# Patient Record
Sex: Male | Born: 1945 | Race: Black or African American | Hispanic: No | Marital: Single | Smoking: Former smoker
Health system: Southern US, Community
[De-identification: ages and names within clinical notes are randomized; demographics above are authoritative.]

## PROBLEM LIST (undated history)

## (undated) DIAGNOSIS — M109 Gout, unspecified: Secondary | ICD-10-CM

## (undated) DIAGNOSIS — I4891 Unspecified atrial fibrillation: Secondary | ICD-10-CM

## (undated) DIAGNOSIS — I10 Essential (primary) hypertension: Secondary | ICD-10-CM

---

## 2013-02-28 ENCOUNTER — Emergency Department: Payer: Self-pay | Admitting: Emergency Medicine

## 2018-05-08 ENCOUNTER — Other Ambulatory Visit: Payer: Self-pay

## 2018-05-08 ENCOUNTER — Emergency Department: Payer: Medicare Other

## 2018-05-08 ENCOUNTER — Emergency Department
Admission: EM | Admit: 2018-05-08 | Discharge: 2018-05-08 | Disposition: A | Discharge status: Home / Self Care | Payer: Medicare Other | Attending: Emergency Medicine | Admitting: Emergency Medicine

## 2018-05-08 ENCOUNTER — Encounter: Payer: Self-pay | Admitting: Emergency Medicine

## 2018-05-08 DIAGNOSIS — Z7901 Long term (current) use of anticoagulants: Secondary | ICD-10-CM | POA: Insufficient documentation

## 2018-05-08 DIAGNOSIS — R319 Hematuria, unspecified: Secondary | ICD-10-CM | POA: Insufficient documentation

## 2018-05-08 DIAGNOSIS — Z87891 Personal history of nicotine dependence: Secondary | ICD-10-CM | POA: Insufficient documentation

## 2018-05-08 DIAGNOSIS — I10 Essential (primary) hypertension: Secondary | ICD-10-CM | POA: Insufficient documentation

## 2018-05-08 DIAGNOSIS — Z79899 Other long term (current) drug therapy: Secondary | ICD-10-CM | POA: Diagnosis not present

## 2018-05-08 DIAGNOSIS — R160 Hepatomegaly, not elsewhere classified: Secondary | ICD-10-CM

## 2018-05-08 HISTORY — DX: Gout, unspecified: M10.9

## 2018-05-08 HISTORY — DX: Unspecified atrial fibrillation: I48.91

## 2018-05-08 HISTORY — DX: Essential (primary) hypertension: I10

## 2018-05-08 LAB — CBC
HCT: 42.1 % (ref 39.0–52.0)
Hemoglobin: 13.6 g/dL (ref 13.0–17.0)
MCH: 26 pg (ref 26.0–34.0)
MCHC: 32.3 g/dL (ref 30.0–36.0)
MCV: 80.3 fL (ref 80.0–100.0)
Platelets: 237 10*3/uL (ref 150–400)
RBC: 5.24 MIL/uL (ref 4.22–5.81)
RDW: 15 % (ref 11.5–15.5)
WBC: 12.4 10*3/uL — ABNORMAL HIGH (ref 4.0–10.5)
nRBC: 0 % (ref 0.0–0.2)

## 2018-05-08 LAB — URINALYSIS, COMPLETE (UACMP) WITH MICROSCOPIC
Bacteria, UA: NONE SEEN
Bilirubin Urine: NEGATIVE
Glucose, UA: NEGATIVE mg/dL
KETONES UR: 5 mg/dL — AB
Leukocytes, UA: NEGATIVE
NITRITE: NEGATIVE
Protein, ur: 30 mg/dL — AB
Specific Gravity, Urine: 1.017 (ref 1.005–1.030)
pH: 5 (ref 5.0–8.0)

## 2018-05-08 LAB — COMPREHENSIVE METABOLIC PANEL
ALT: 38 U/L (ref 0–44)
AST: 46 U/L — ABNORMAL HIGH (ref 15–41)
Albumin: 4 g/dL (ref 3.5–5.0)
Alkaline Phosphatase: 90 U/L (ref 38–126)
Anion gap: 11 (ref 5–15)
BUN: 16 mg/dL (ref 8–23)
CHLORIDE: 97 mmol/L — AB (ref 98–111)
CO2: 23 mmol/L (ref 22–32)
Calcium: 9.1 mg/dL (ref 8.9–10.3)
Creatinine, Ser: 0.91 mg/dL (ref 0.61–1.24)
GFR calc Af Amer: >60 mL/min (ref >60)
GFR calc non Af Amer: >60 mL/min (ref >60)
Glucose, Bld: 111 mg/dL — ABNORMAL HIGH (ref 70–99)
Potassium: 3.8 mmol/L (ref 3.5–5.1)
Sodium: 131 mmol/L — ABNORMAL LOW (ref 135–145)
Total Bilirubin: 1.7 mg/dL — ABNORMAL HIGH (ref 0.3–1.2)
Total Protein: 8.5 g/dL — ABNORMAL HIGH (ref 6.5–8.1)

## 2018-05-08 MED ORDER — OXYCODONE-ACETAMINOPHEN 5-325 MG PO TABS
1.0000 | ORAL_TABLET | Freq: Once | ORAL | Status: AC
  Administered 2018-05-08: 1 via ORAL
  Filled 2018-05-08: qty 1

## 2018-05-08 MED ORDER — PREDNISONE 20 MG PO TABS
60.0000 mg | ORAL_TABLET | Freq: Once | ORAL | Status: AC
  Administered 2018-05-08: 60 mg via ORAL
  Filled 2018-05-08: qty 3

## 2018-05-08 NOTE — ED Provider Notes (Signed)
[facility] Emergency Department Provider Note    First MD Initiated Contact with Patient 05/08/18 0600     (approximate)  I have reviewed the triage vital signs and the nursing notes.   HISTORY  Chief Complaint Hematuria    HPI Lucas Wheeler is a 73 y.o. male with below list of chronic medical conditions including atrial fibrillation gout and hypertension presents to the emergency department with dark urine noted this morning with concern for possible hematuria.  Of note the patient was started on Pradaxa 5 days ago.  Patient denies any abdominal pain or flank pain.  Patient denies any fever.  Patient does admit however to bilateral knee and foot pain which patient states is consistent with previous episodes of gout.  Patient states he was advised to not take indomethacin as a result of being prescribed Pradaxa.   Past Medical History:  Diagnosis Date  . Atrial fibrillation (HCC)   . Gout   . Hypertension     There are no active problems to display for this patient.    Prior to Admission medications   Medication Sig Start Date End Date Taking? Authorizing Provider  allopurinol (ZYLOPRIM) 300 MG tablet Take 300 mg by mouth daily.   Yes [provider]  amLODipine (NORVASC) 10 MG tablet Take 10 mg by mouth daily.   Yes [provider]  atorvastatin (LIPITOR) 80 MG tablet Take 40 mg by mouth at bedtime.   Yes [provider]  dabigatran (PRADAXA) 150 MG CAPS capsule Take 150 mg by mouth 2 (two) times daily.   Yes [provider]    Allergies No known drug allergies No family history on file.  Social History Social History   Tobacco Use  . Smoking status: Former Games developer  . Smokeless tobacco: Former Engineer, water Use Topics  . Alcohol use: Not Currently  . Drug use: Never    Review of Systems Constitutional: No fever/chills Eyes: No visual changes. ENT: No sore throat. Cardiovascular: Denies chest  pain. Respiratory: Denies shortness of breath. Gastrointestinal: No abdominal pain.  No nausea, no vomiting.  No diarrhea.  No constipation. Genitourinary: Negative for dysuria.  Positive for dark urine musculoskeletal: Negative for neck pain.  Negative for back pain. Integumentary: Negative for rash. Neurological: Negative for headaches, focal weakness or numbness.    ____________________________________________   PHYSICAL EXAM:  VITAL SIGNS: ED Triage Vitals  Enc Vitals Group     BP 05/08/18 0549 (!) 154/63     Pulse Rate 05/08/18 0549 100     Resp 05/08/18 0549 18     Temp 05/08/18 0549 98.1 F (36.7 C)     Temp Source 05/08/18 0549 Oral     SpO2 05/08/18 0549 99 %     Weight 05/08/18 0550 106.1 kg (234 lb)     Height 05/08/18 0550 1.905 m (6\' 3" )     Head Circumference --      Peak Flow --      Pain Score 05/08/18 0550 8     Pain Loc --      Pain Edu? --      Excl. in GC? --     Constitutional: Alert and oriented. Well appearing and in no acute distress. Eyes: Conjunctivae are normal.  Mouth/Throat: Mucous membranes are moist.  Oropharynx non-erythematous. Neck: No stridor.   Cardiovascular: Normal rate, regular rhythm. Good peripheral circulation. Grossly normal heart sounds. Respiratory: Normal respiratory effort.  No retractions. Lungs CTAB. Gastrointestinal:  Soft and nontender. No distention.  Musculoskeletal: No lower extremity tenderness nor edema. No gross deformities of extremities. Neurologic:  Normal speech and language. No gross focal neurologic deficits are appreciated.  Skin:  Skin is warm, dry and intact. No rash noted. Psychiatric: Mood and affect are normal. Speech and behavior are normal.  ____________________________________________   LABS (all labs ordered are listed, but only abnormal results are displayed)  Labs Reviewed  CBC - Abnormal; Notable for the following components:      Result Value   WBC 12.4 (*)    All other components within  normal limits  COMPREHENSIVE METABOLIC PANEL - Abnormal; Notable for the following components:   Sodium 131 (*)    Chloride 97 (*)    Glucose, Bld 111 (*)    Total Protein 8.5 (*)    AST 46 (*)    Total Bilirubin 1.7 (*)    All other components within normal limits  URINALYSIS, COMPLETE (UACMP) WITH MICROSCOPIC       Procedures   ____________________________________________   INITIAL IMPRESSION / ASSESSMENT AND PLAN / ED COURSE  As part of my medical decision making, I reviewed the following data within the electronic MEDICAL RECORD NUMBER   73 year old male currently anticoagulated with Pradaxa presenting with painless hematuria.  Awaiting urinalysis and CT renal studies.  Patient given prednisone and Percocet for knee and foot pain secondary to gout.  Patient's care transferred to Dr. Alphonzo Lemmings ____________________________________________  FINAL CLINICAL IMPRESSION(S) / ED DIAGNOSES  Final diagnoses:  Hematuria, unspecified type     MEDICATIONS GIVEN DURING THIS VISIT:  Medications  oxyCODONE-acetaminophen (PERCOCET/ROXICET) 5-325 MG per tablet 1 tablet (1 tablet Oral Given 05/08/18 0622)  predniSONE (DELTASONE) tablet 60 mg (60 mg Oral Given 05/08/18 8786)     ED Discharge Orders    None       Note:  This document was prepared using Dragon voice recognition software and may include unintentional dictation errors.    Darci Current, MD 05/08/18 [phone]

## 2018-05-08 NOTE — ED Notes (Signed)
Report received, care of pt assumed.  Pt returning from CT at this time, NAD noted, will monitor.

## 2018-05-08 NOTE — ED Triage Notes (Signed)
EMS pt to rm 25 from home with report of bloody red urine this morning when he got up to urinate. Denies pain, discomfort or other co's. Hx of afib and was put on blood thinner this last Tuesday. Takes the medication 2 x a day. Does not know the name.

## 2018-05-08 NOTE — Discharge Instructions (Addendum)
Return to the emergency room for severe bleeding, weakness, lightheaded, black stool, bleeding from your bottom, or any other new or worrisome symptoms including fever or pain.  We do notice, on your CT scan, that there is a 3 cm mass.  This could be benign but it could also be something very serious like cancer.  We do not want to scare you, but we want to make sure that you understand it is very important to follow-up.  Please tell your primary care doctor about this, and I am printing here the results from the CT scan so that you know what to tell them.  That will mean that there is private medical information on the street please do not lose it.  It is important that you call your primary care doctor and the cancer center in the next couple days to have this further evaluated.   1. No nephroureterolithiasis.  No hydronephrosis. 2. Indeterminate 3 cm low-attenuation mass within the right hepatic lobe. This needs dedicated evaluation with pre and post contrast-enhanced CT or MRI. 3. Indeterminate retroperitoneal and pelvic lymphadenopathy as described above. Recommend clinical and laboratory correlation to exclude lymphoproliferative disorder. If these are unremarkable, recommend follow-up CT in 3 months to assess for stability. 4. Left inguinal hernia containing a portion of nonobstructed descending colon and fat. 5. Abdominal aorta measures 2.5 cm. Ectatic abdominal aorta at risk for aneurysm development. Recommend followup by ultrasound in 5 years. This recommendation follows ACR consensus guidelines: White Paper of the ACR Incidental Findings Committee II on Vascular Findings. J Am Coll Radiol 2013; 10:789-794.

## 2018-05-08 NOTE — ED Provider Notes (Addendum)
-----------------------------------------   9:27 AM on 05/08/2018 -----------------------------------------  Not to me this morning at 715, patient with recent addition of anticoagulation to his med list, because of atrial fibrillation presents with one episode of mild hematuria.  No melena no bright red blood per rectum no pain no swelling no flank pain no fever no signs of infection, patient had a CT scan which did not show any primary urologic pathology but which did reveal a small liver lesion and some questionable lymphadenopathy.  I explained all of this expressly and extensively to the patient and his daughter, they understand that he must get outpatient follow-up for this to rule out cancer and I did tell him it could be cancer.  They understand the need for follow-up and I will provide them with that.  In addition, we will have him follow-up with urology for his hematuria.  Given that he does have some degree of risk of CVA, and this seems to be a mild amount of bleeding at this time we will not reverse his anticoagulation but I did give him extensive return precautions about this.  He is not anemic there is no evidence of significant bleeding, no evidence of infection, patient and family are very comfortable with this plan requesting discharge which we will now provide.   Jeanmarie Plant, MD 05/08/18 5643    Jeanmarie Plant, MD 05/08/18 [phone]

## 2018-05-09 LAB — URINE CULTURE: Culture: NO GROWTH

## 2020-05-29 IMAGING — CT CT RENAL STONE PROTOCOL
2 of 4 series · 15 of 46 positions shown, 17 images · non-contrast
Comparison: None.

CLINICAL DATA: Patient with hematuria.

EXAM:
CT ABDOMEN AND PELVIS WITHOUT CONTRAST
TECHNIQUE: Multidetector CT imaging of the abdomen and pelvis was performed
following the standard protocol without IV contrast.

[Series 2: stone full standard · axial · 0.79mm/px · z∈[-1198,-743]mm · 12 of 101 slices shown, 14 images]
[im 5/101  soft-tissue]
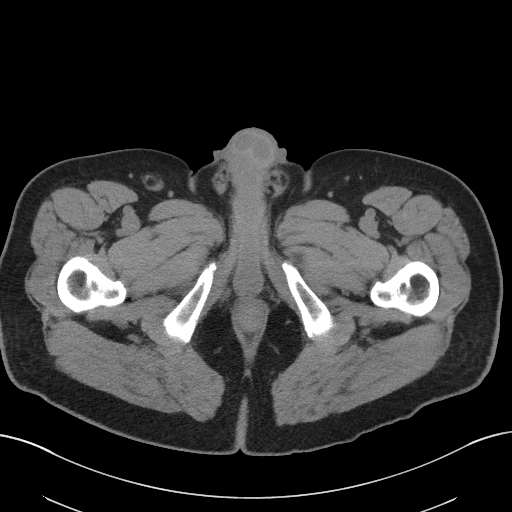
[im 5/101  bone]
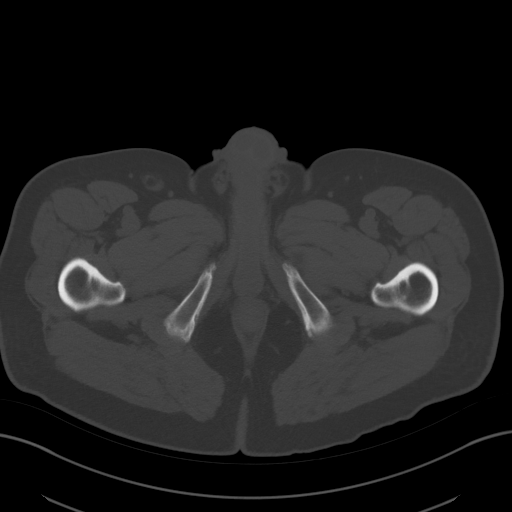
[im 14/101  soft-tissue]
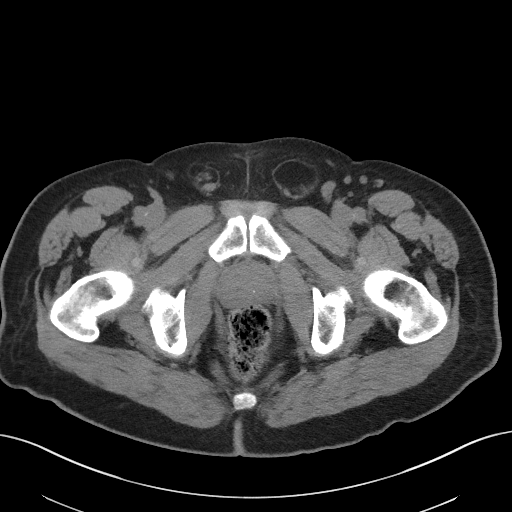
[im 22/101  soft-tissue]
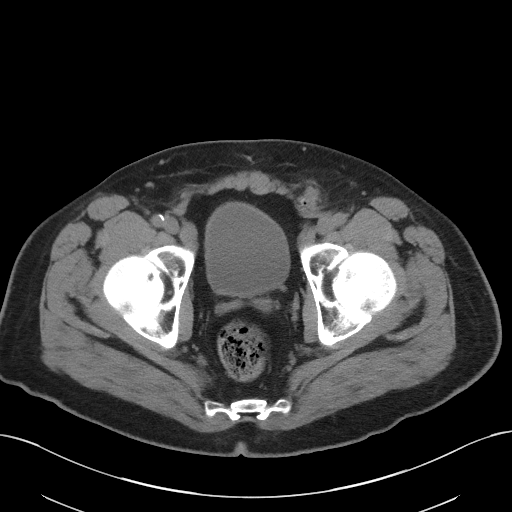
[im 31/101  soft-tissue]
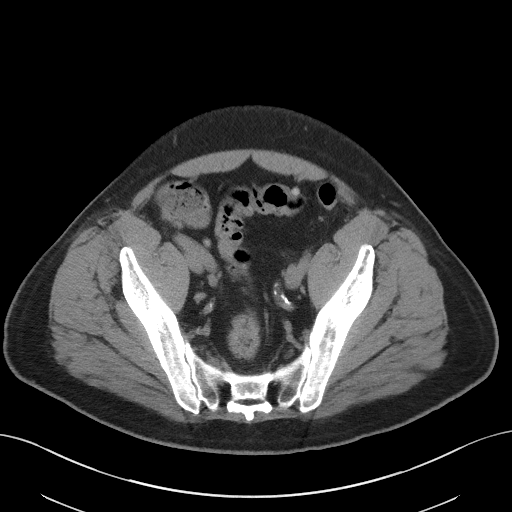
[im 40/101  soft-tissue]
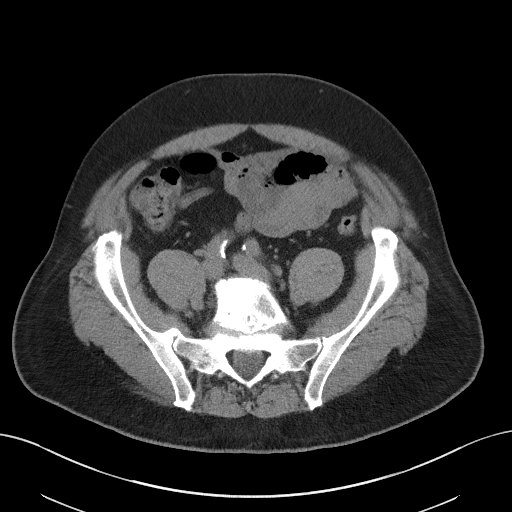
[im 48/101  soft-tissue]
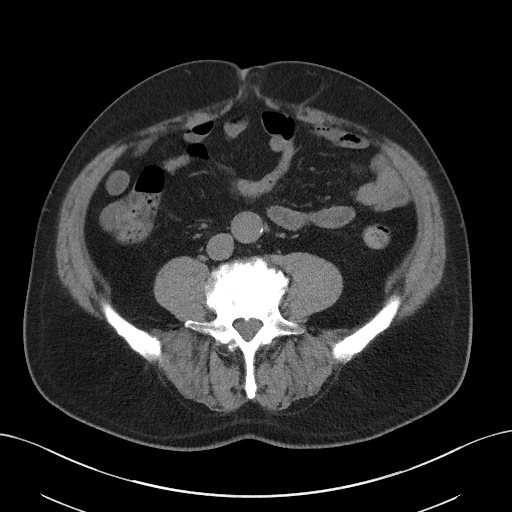
[im 53/101  soft-tissue]
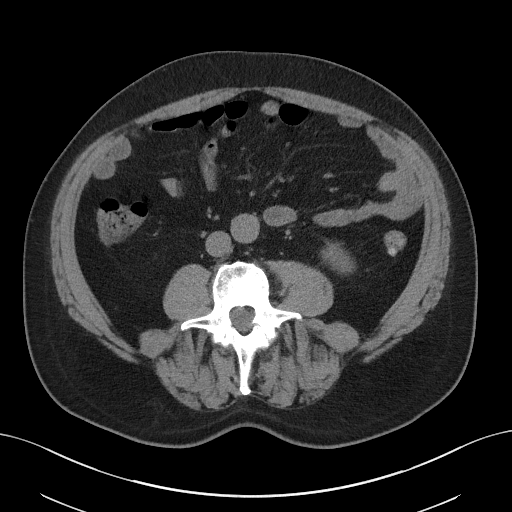
[im 61/101  soft-tissue]
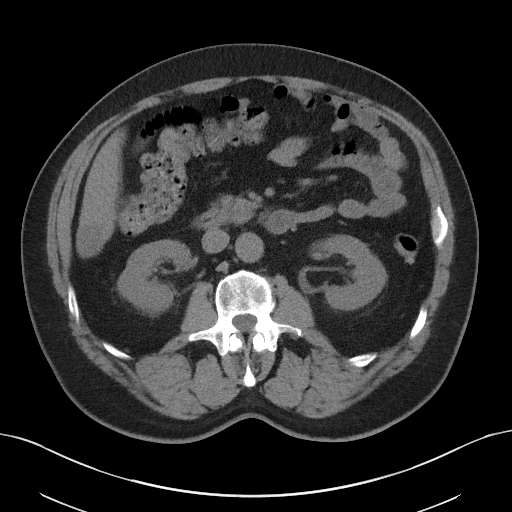
[im 70/101  soft-tissue]
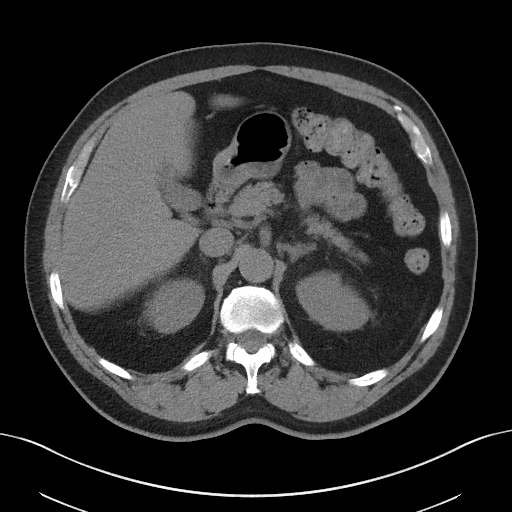
[im 70/101  bone]
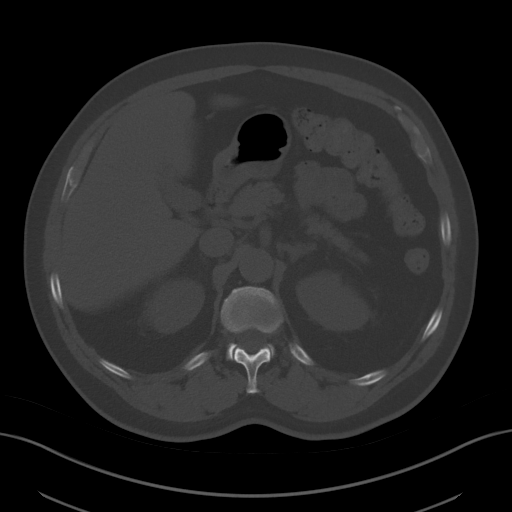
[im 79/101  soft-tissue]
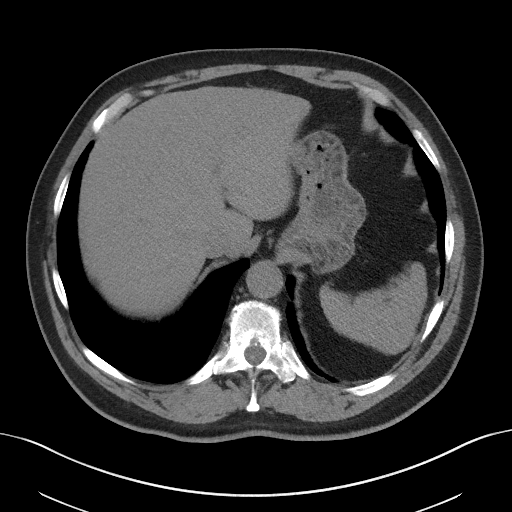
[im 87/101  soft-tissue]
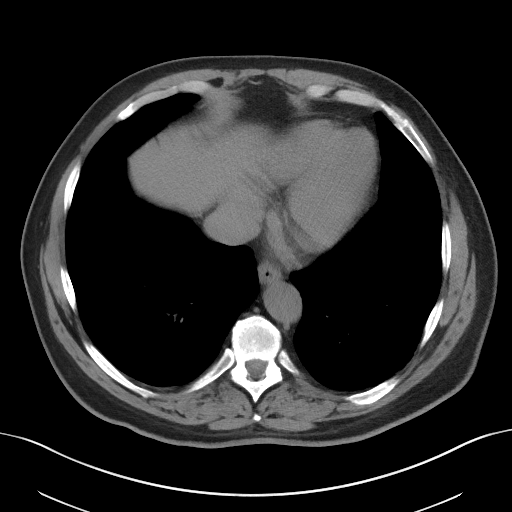
[im 96/101  soft-tissue]
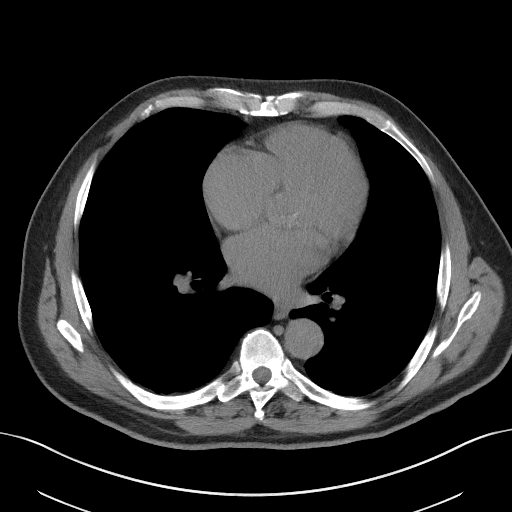

[Series 5: coronal · coronal · 0.81mm/px · 3 of 154 slices shown]
[im 52/154  soft-tissue]
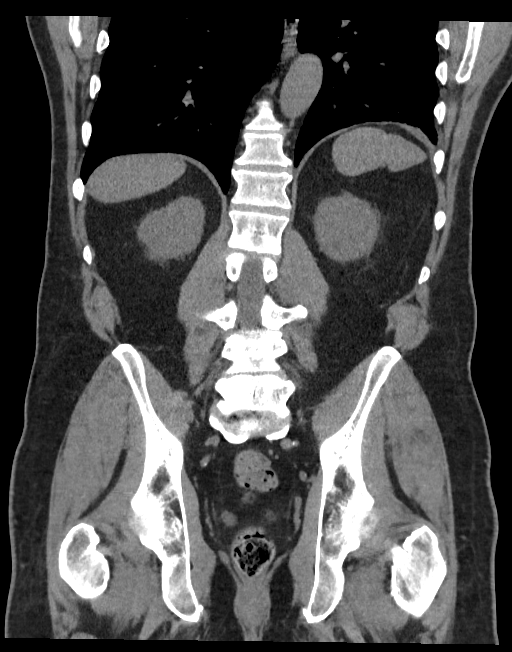
[im 69/154  soft-tissue]
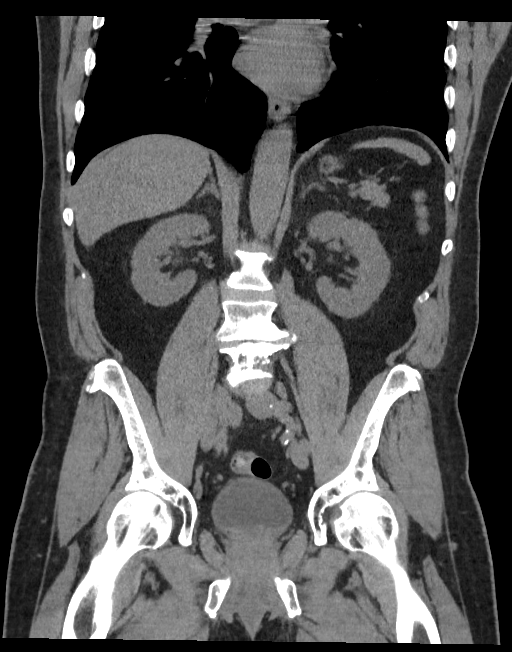
[im 86/154  soft-tissue]
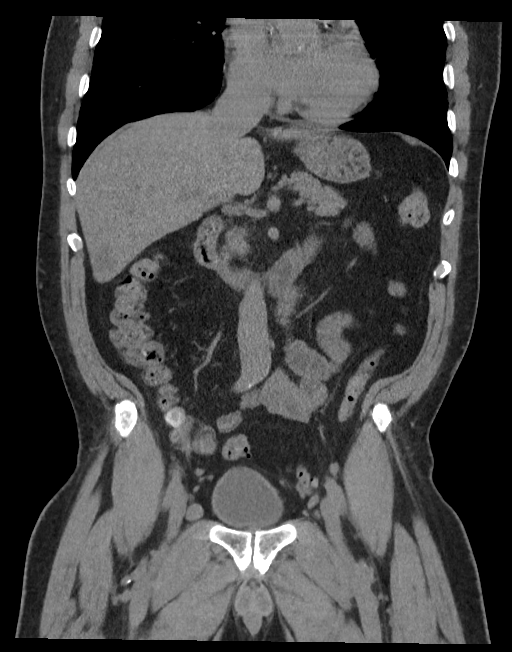

[15 of 46 positions shown; findings below may reference images not displayed]

FINDINGS: Lower chest: Normal heart size. Dependent atelectasis within the
left lower lobe. No pleural effusion.

Hepatobiliary: Liver is diffusely low in attenuation compatible with
steatosis. There is a 3.0 x 2.5 cm low-density mass within the right
hepatic lobe (image 40; series 2). Additionally within the right
hepatic lobe there is a 1.9 cm fluid density mass (image 34; series
2), favored to represent a cyst. Gallbladder is unremarkable. No
intrahepatic or extrahepatic biliary ductal dilatation.

Pancreas: Unremarkable

Spleen: Unremarkable

Adrenals/Urinary Tract: Normal adrenal glands. Kidneys are symmetric
in size. No nephroureterolithiasis. No hydronephrosis. Urinary
bladder is unremarkable.

Stomach/Bowel: Normal morphology of the stomach. No evidence for
small bowel obstruction. No free fluid or free intraperitoneal air.
Descending and sigmoid colonic diverticulosis. Normal appendix.

Vascular/Lymphatic: Peripheral calcified atherosclerotic plaque
involving the abdominal aorta. Infrarenal abdominal aorta measures
2.5 cm. 1.1 cm right external iliac lymph node (image 64; series [DATE] cm right external iliac lymph node (image 74; series [DATE] cm
left iliac lymph node (image 60; series 2).

Reproductive: Heterogeneous prostate.

Other: Left inguinal hernia containing fat and portion of descending
colon (image 58; series 5). Small fat containing right inguinal
hernia.

Musculoskeletal: Lower lumbar spine degenerative changes. Bilateral
L5 pars defects.
IMPRESSION: 1. No nephroureterolithiasis.  No hydronephrosis.
2. Indeterminate 3 cm low-attenuation mass within the right hepatic
lobe. This needs dedicated evaluation with pre and post
contrast-enhanced CT or MRI.
3. Indeterminate retroperitoneal and pelvic lymphadenopathy as
described above. Recommend clinical and laboratory correlation to
exclude lymphoproliferative disorder. If these are unremarkable,
recommend follow-up CT in 3 months to assess for stability.
4. Left inguinal hernia containing a portion of nonobstructed
descending colon and fat.
5. Abdominal aorta measures 2.5 cm. Ectatic abdominal aorta at risk
for aneurysm development. Recommend followup by ultrasound in 5
years. This recommendation follows ACR consensus guidelines: White
Paper of the ACR Incidental Findings Committee II on Vascular
Findings. [HOSPITAL] 0950; [DATE].

## 2023-05-06 ENCOUNTER — Emergency Department
Admission: EM | Admit: 2023-05-06 | Discharge: 2023-05-06 | Disposition: A | Discharge status: Home / Self Care | Payer: Self-pay | Attending: Emergency Medicine | Admitting: Emergency Medicine

## 2023-05-06 ENCOUNTER — Other Ambulatory Visit: Payer: Self-pay

## 2023-05-06 ENCOUNTER — Emergency Department: Payer: No Typology Code available for payment source

## 2023-05-06 DIAGNOSIS — S0990XA Unspecified injury of head, initial encounter: Secondary | ICD-10-CM | POA: Insufficient documentation

## 2023-05-06 NOTE — Discharge Instructions (Signed)
 Your CT scans were normal.  Please follow-up with your outpatient provider.  Please return for any new, worsening, or change in symptoms or other concerns including worsening headache, numbness, tingling, weakness, vomiting, visual changes, or any other concerns.  It was a pleasure caring for you today.
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
